# Patient Record
Sex: Male | Born: 2000 | Race: Black or African American | Hispanic: No | Marital: Single | Smoking: Never smoker
Health system: Southern US, Community
[De-identification: ages and names within clinical notes are randomized; demographics above are authoritative.]

## PROBLEM LIST (undated history)

## (undated) DIAGNOSIS — Z789 Other specified health status: Secondary | ICD-10-CM

## (undated) HISTORY — DX: Other specified health status: Z78.9

## (undated) HISTORY — PX: NO PAST SURGERIES: SHX2092

---
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
## 2019-01-24 IMAGING — CR DG ABDOMEN ACUTE W/ 1V CHEST
4 series · 4 of 4 positions shown · non-contrast
Comparison: None.

CLINICAL DATA: Generalized abdominal discomfort for 6 days.
Postprandial nausea and vomiting.

EXAM:
DG ABDOMEN ACUTE W/ 1V CHEST

[chest pa]
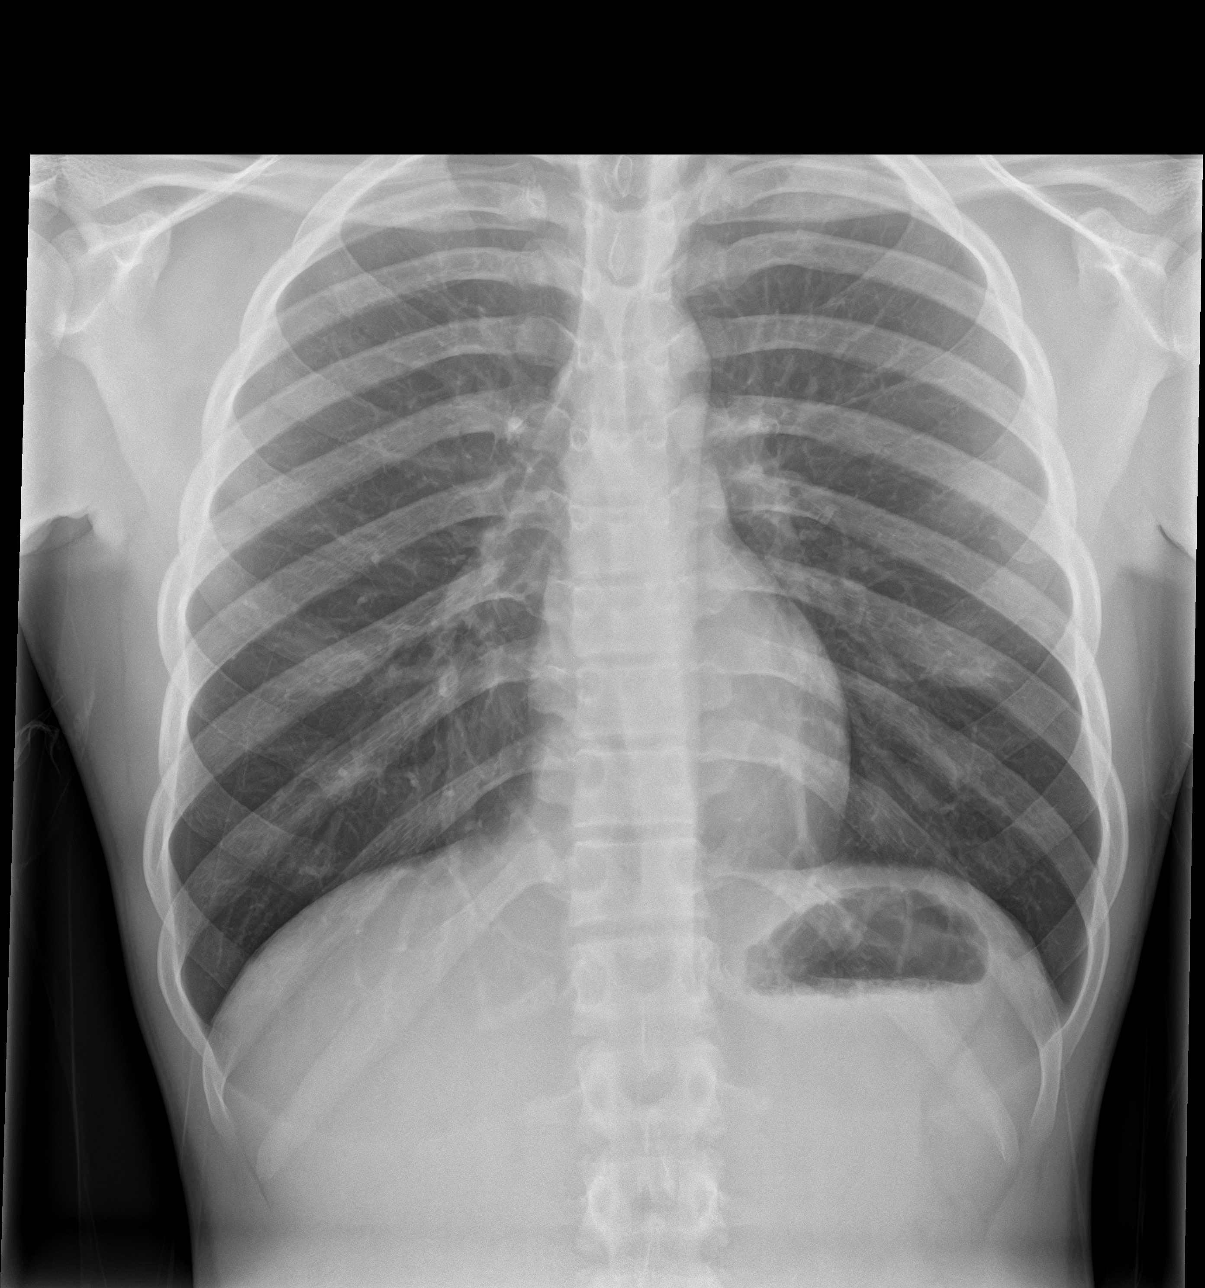

[abdomen erect]
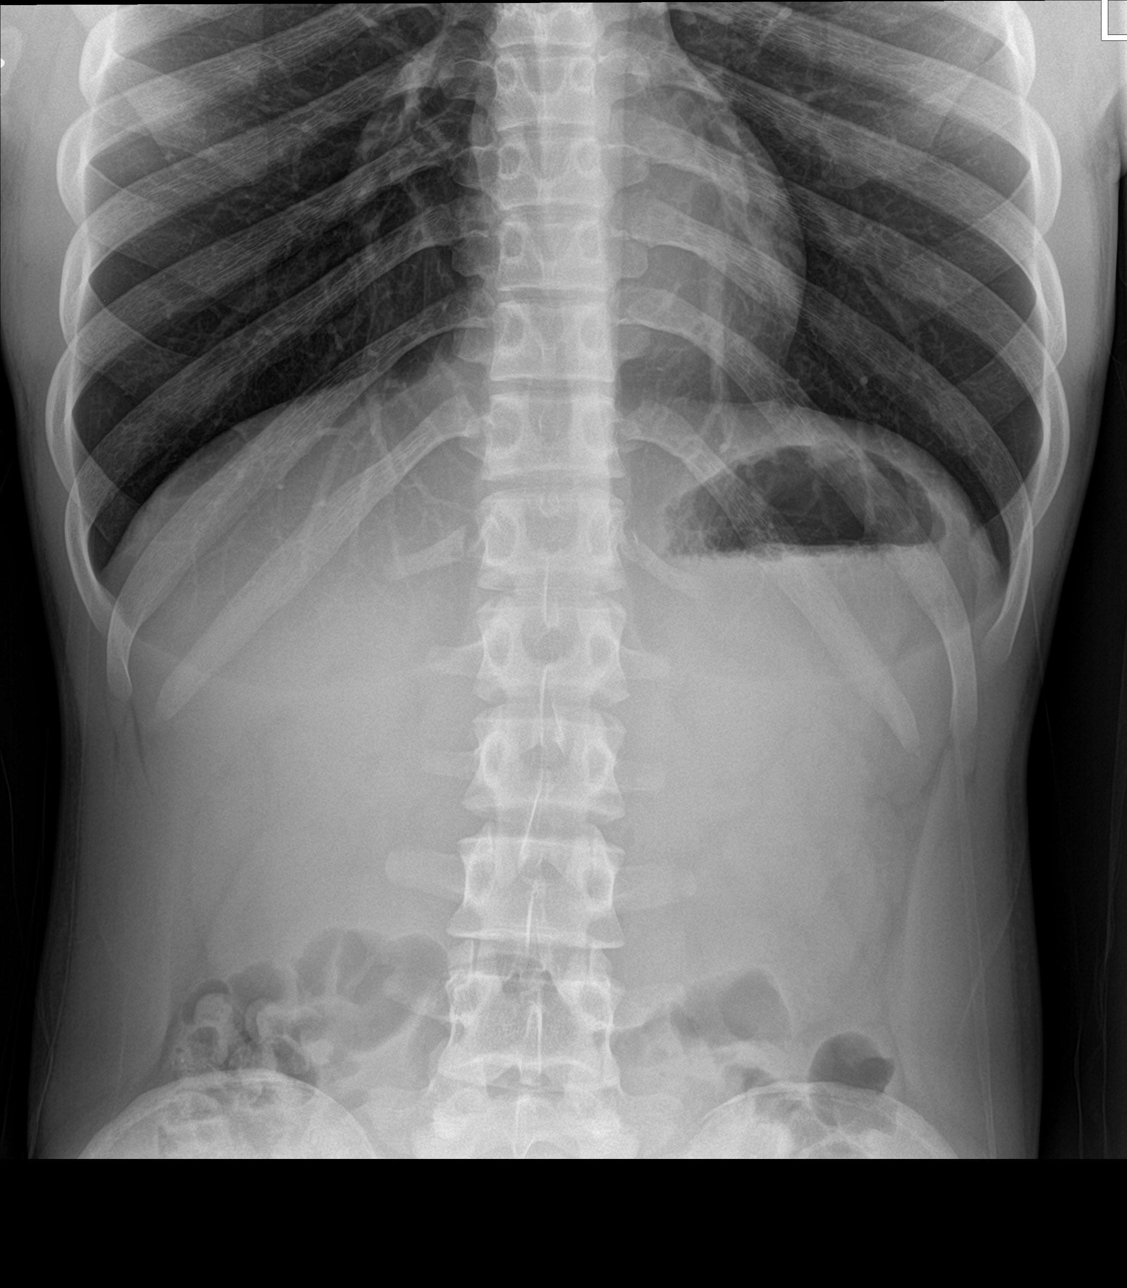

[abdomen supine (1 of 2)]
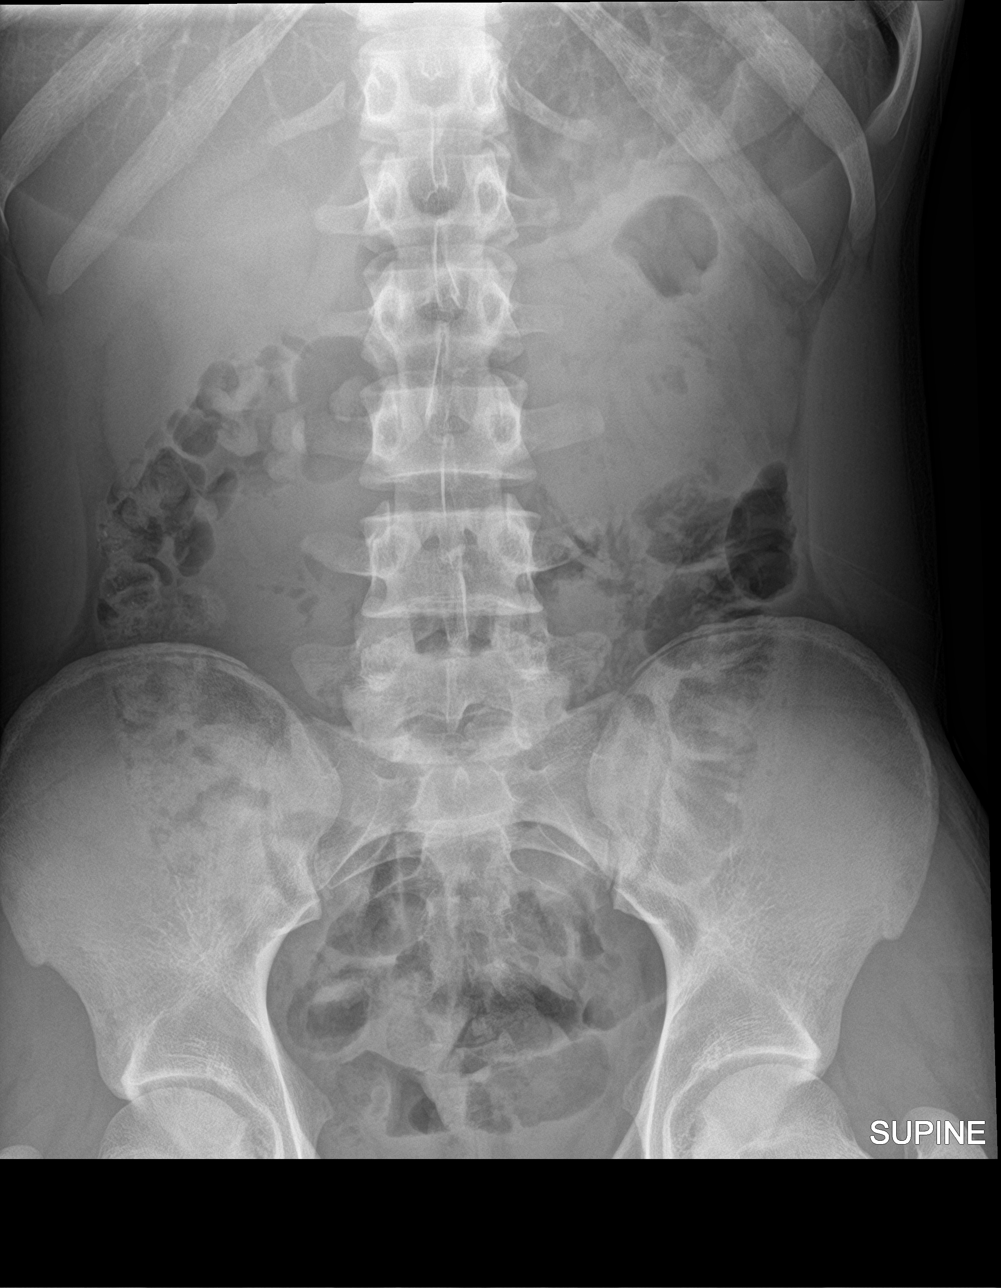

[abdomen supine (2 of 2)]
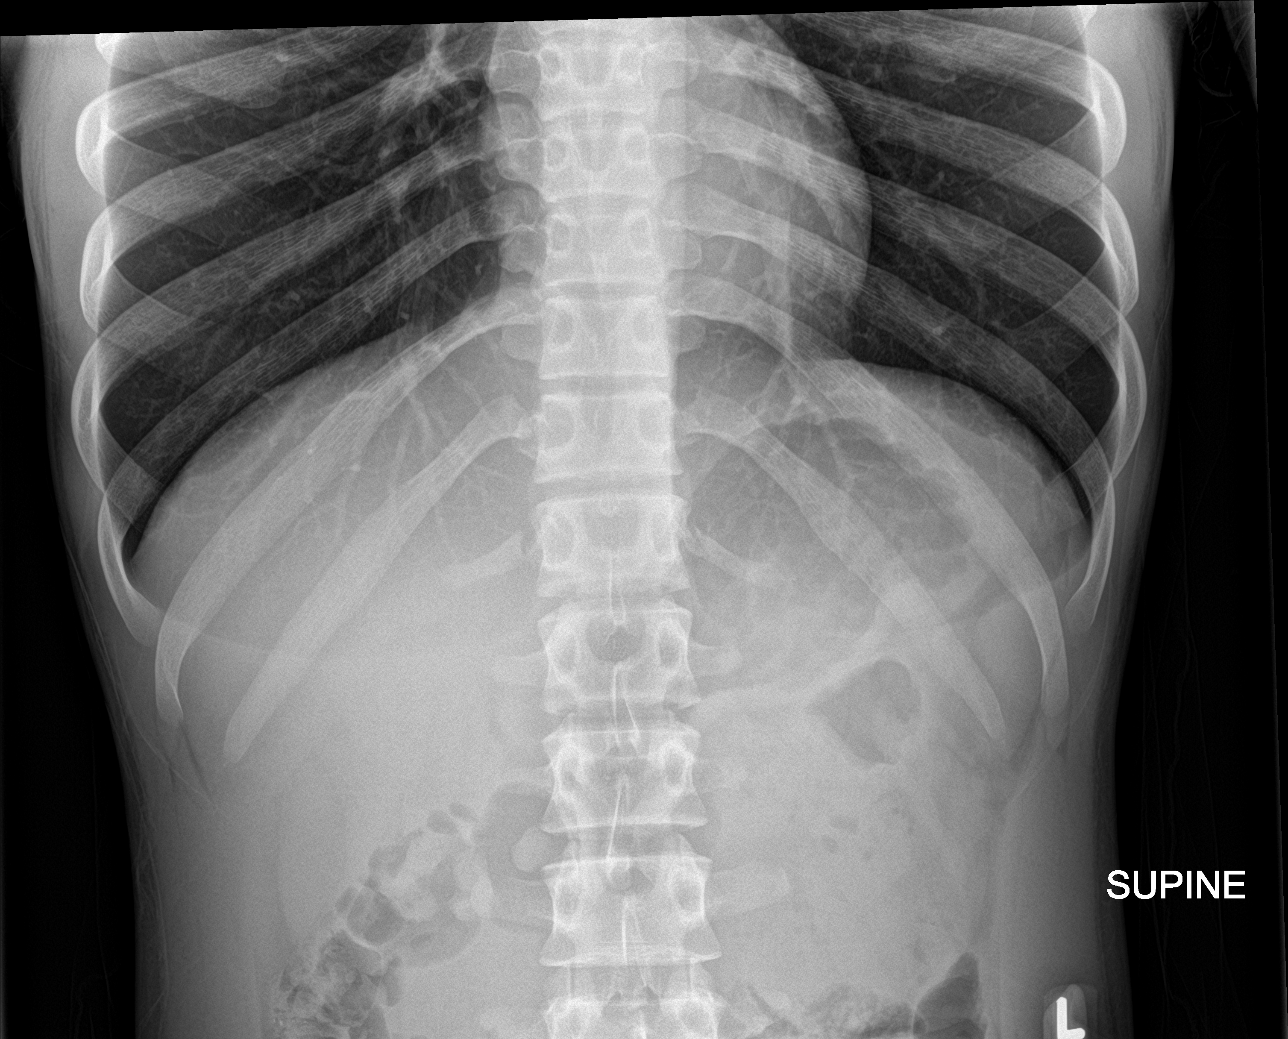

[4 of 4 positions shown; findings below may reference images not displayed]

FINDINGS: Cardiomediastinal silhouette is normal. Lungs are clear, no pleural
effusions. No pneumothorax. Soft tissue planes and included osseous
structures are unremarkable.

Bowel gas pattern is nondilated and nonobstructive. No
intra-abdominal mass effect, pathologic calcifications or free air.
Soft tissue planes and included osseous structures are
non-suspicious. Skeletally immature.
IMPRESSION: Normal chest.

Normal bowel gas pattern.

## 2019-02-06 IMAGING — CR DG CHEST 2V
2 series · 2 of 2 positions shown · non-contrast
Comparison: 12/20/2016

CLINICAL DATA: Central chest pain. Sensation of something stuck in
throat for 1 week. Pain worse after eating.

EXAM:
CHEST  2 VIEW

[chest pa]
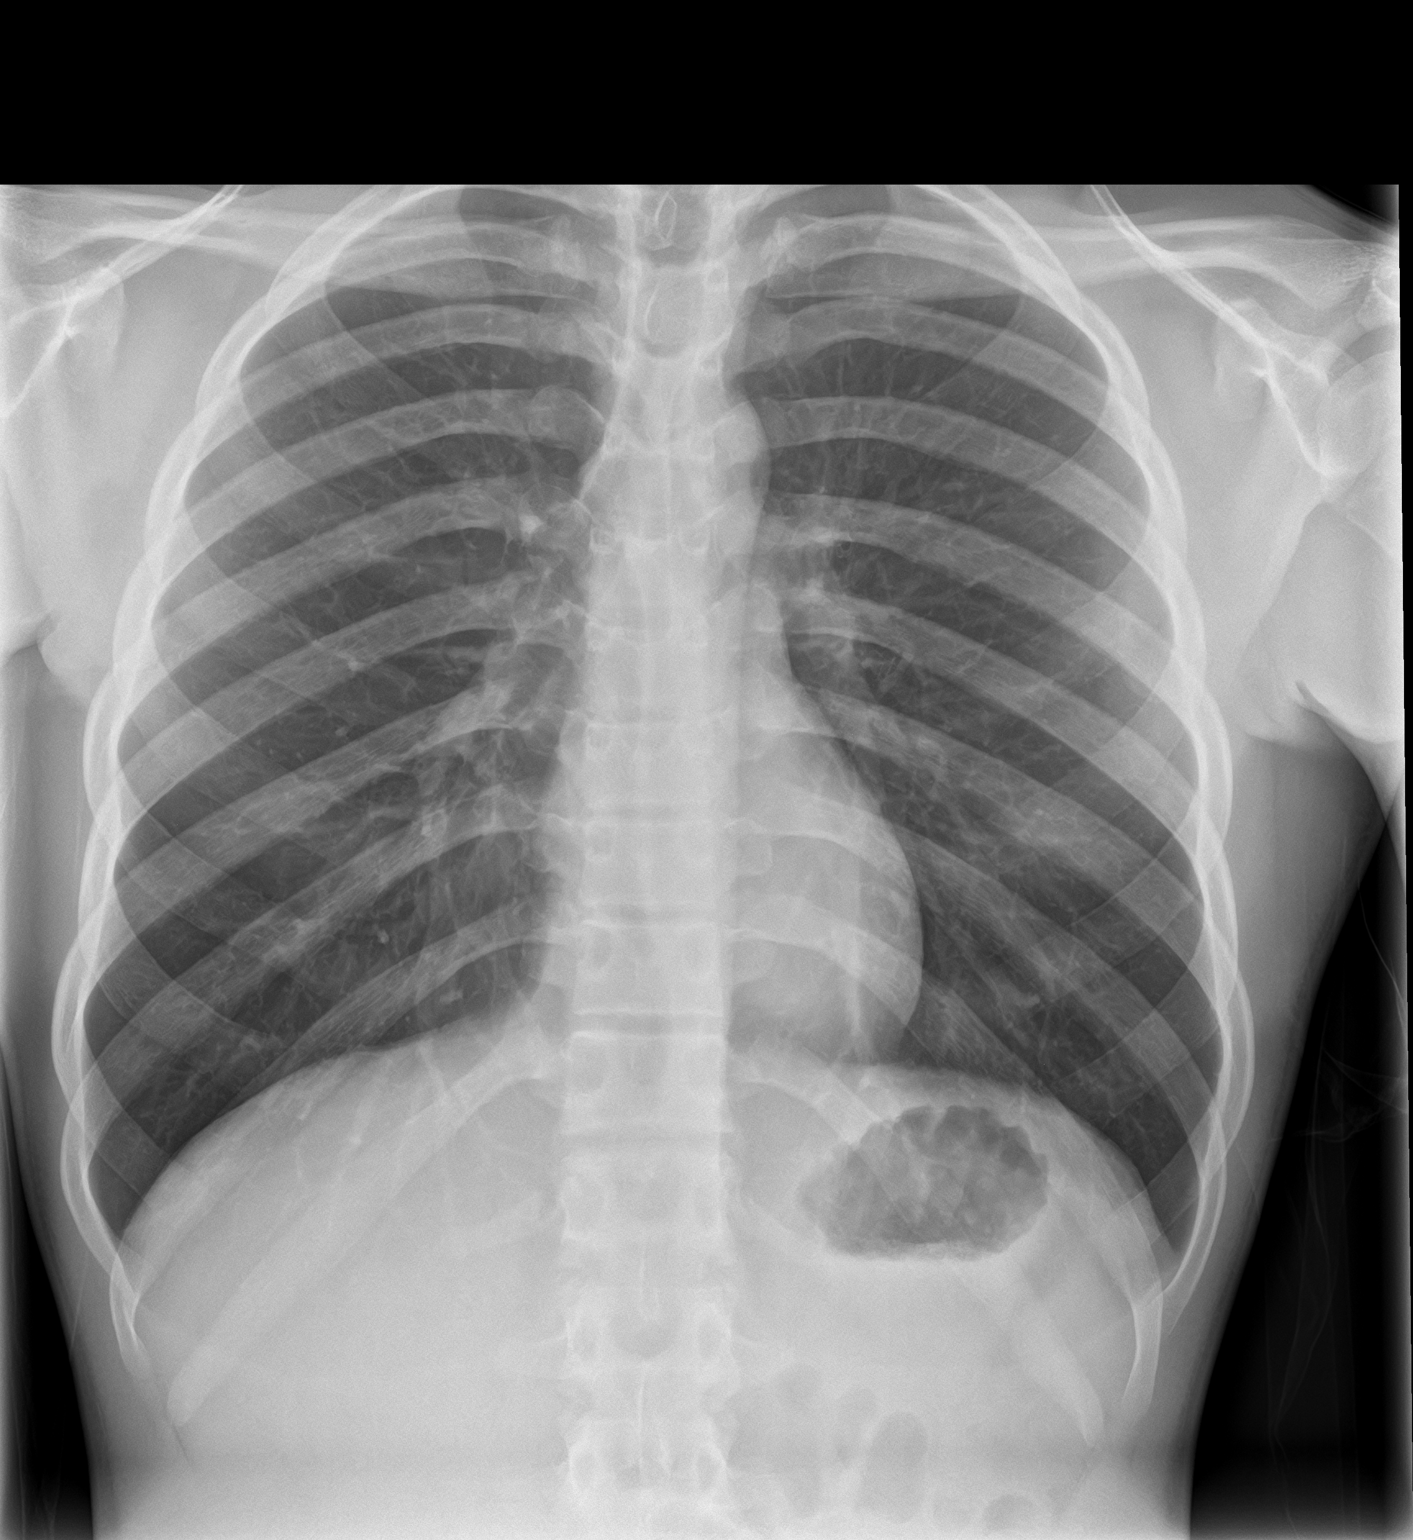

[chest lat]
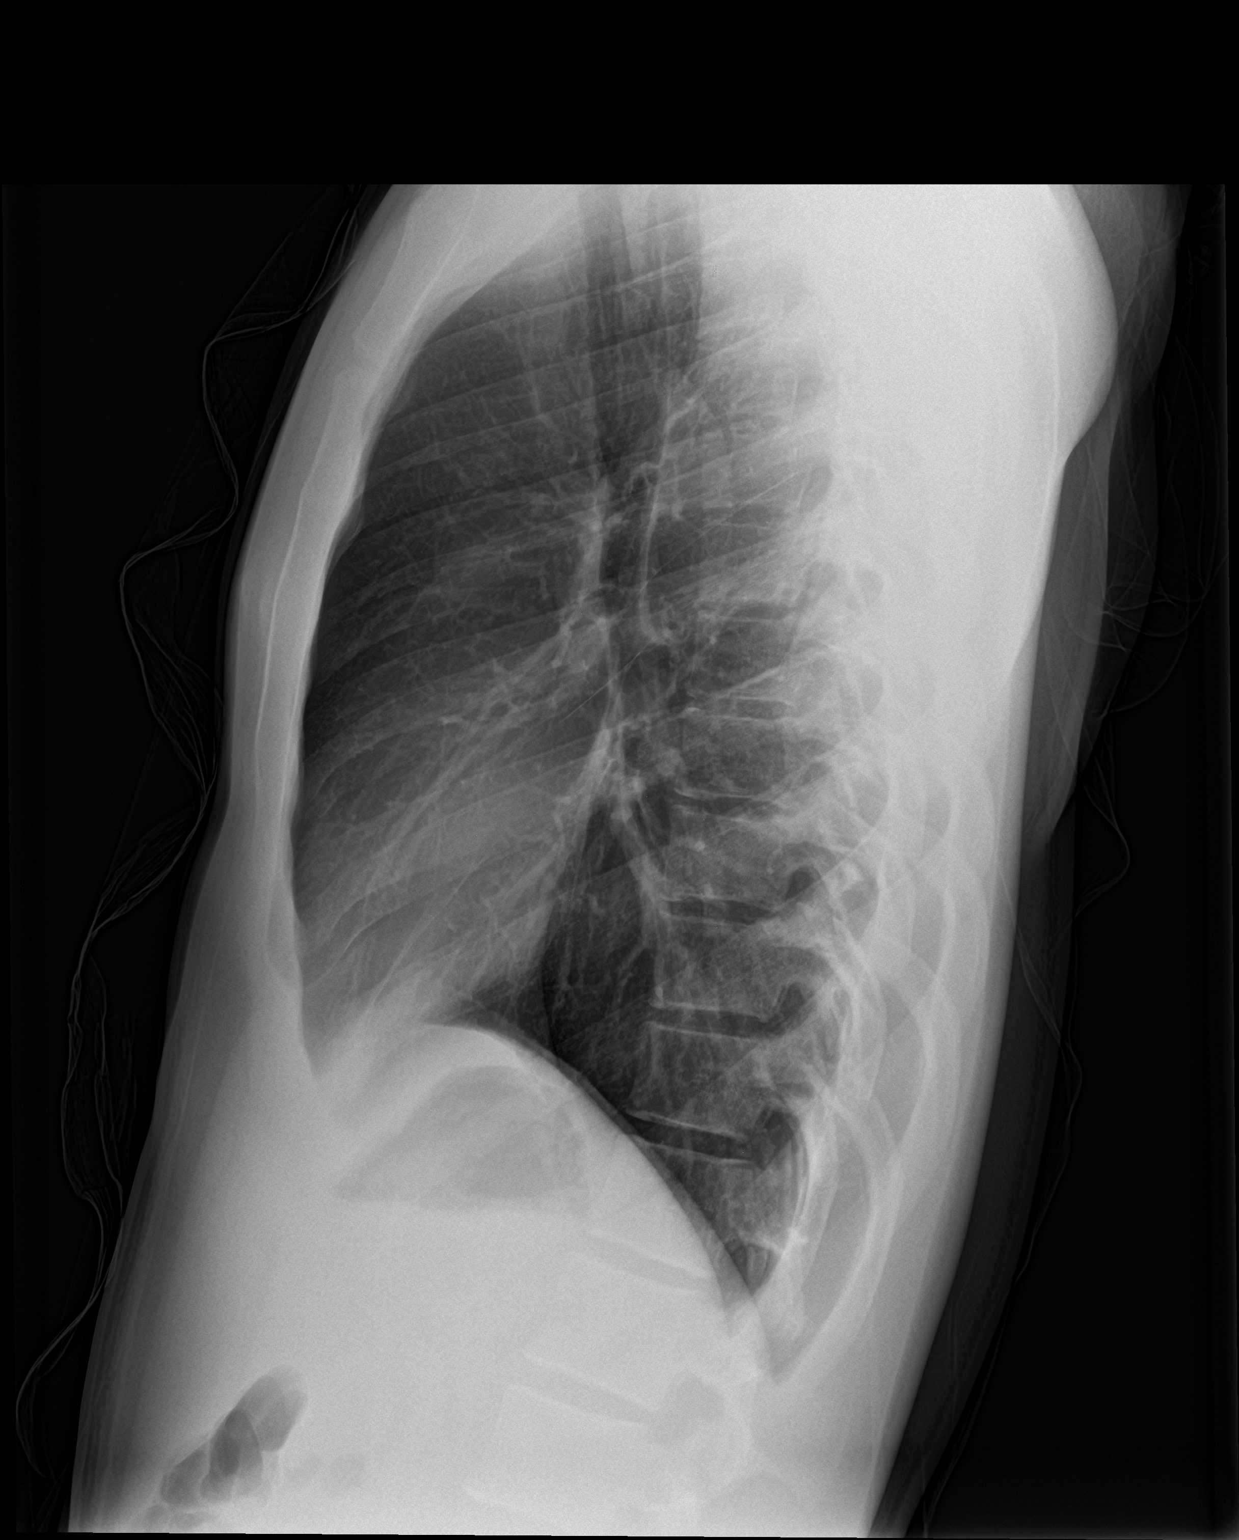

[2 of 2 positions shown; findings below may reference images not displayed]

FINDINGS: Normal heart size and pulmonary vascularity. No focal airspace
disease or consolidation in the lungs. No blunting of costophrenic
angles. No pneumothorax. Mediastinal contours appear intact. No
radiopaque foreign bodies identified.
IMPRESSION: No active cardiopulmonary disease.
